# Patient Record
Sex: Male | Born: 1950 | Race: White | Hispanic: No | Marital: Married | Smoking: Current every day smoker
Health system: Southern US, Community
[De-identification: ages and names within clinical notes are randomized; demographics above are authoritative.]

## PROBLEM LIST (undated history)

## (undated) DIAGNOSIS — I10 Essential (primary) hypertension: Secondary | ICD-10-CM

## (undated) DIAGNOSIS — M1990 Unspecified osteoarthritis, unspecified site: Secondary | ICD-10-CM

## (undated) DIAGNOSIS — K219 Gastro-esophageal reflux disease without esophagitis: Secondary | ICD-10-CM

## (undated) HISTORY — PX: JOINT REPLACEMENT: SHX530

---

## 2010-02-07 HISTORY — PX: CHOLECYSTECTOMY: SHX55

## 2010-02-17 ENCOUNTER — Inpatient Hospital Stay (HOSPITAL_COMMUNITY): Admission: EM | Admit: 2010-02-17 | Discharge: 2010-02-18 | Payer: Self-pay | Admitting: Emergency Medicine

## 2010-02-17 ENCOUNTER — Encounter (INDEPENDENT_AMBULATORY_CARE_PROVIDER_SITE_OTHER): Payer: Self-pay | Admitting: General Surgery

## 2010-03-08 ENCOUNTER — Ambulatory Visit: Payer: Self-pay | Admitting: Diagnostic Radiology

## 2010-03-08 ENCOUNTER — Emergency Department (HOSPITAL_BASED_OUTPATIENT_CLINIC_OR_DEPARTMENT_OTHER): Admission: EM | Admit: 2010-03-08 | Discharge: 2010-03-08 | Payer: Self-pay | Admitting: Emergency Medicine

## 2010-08-26 ENCOUNTER — Encounter: Admission: RE | Admit: 2010-08-26 | Discharge: 2010-08-26 | Payer: Self-pay | Admitting: Family Medicine

## 2010-12-27 LAB — DIFFERENTIAL
Basophils Absolute: 0.1 10*3/uL (ref 0.0–0.1)
Basophils Relative: 1 % (ref 0–1)
Eosinophils Absolute: 0.3 10*3/uL (ref 0.0–0.7)
Neutro Abs: 5 10*3/uL (ref 1.7–7.7)
Neutrophils Relative %: 65 % (ref 43–77)

## 2010-12-27 LAB — COMPREHENSIVE METABOLIC PANEL
ALT: 27 U/L (ref 0–53)
Alkaline Phosphatase: 68 U/L (ref 39–117)
BUN: 22 mg/dL (ref 6–23)
CO2: 23 mEq/L (ref 19–32)
Chloride: 101 mEq/L (ref 96–112)
GFR calc non Af Amer: >60 mL/min (ref >60)
Glucose, Bld: 97 mg/dL (ref 70–99)
Potassium: 4.5 mEq/L (ref 3.5–5.1)
Sodium: 137 mEq/L (ref 135–145)
Total Bilirubin: 0.7 mg/dL (ref 0.3–1.2)

## 2010-12-27 LAB — CBC
HCT: 41.4 % (ref 39.0–52.0)
Hemoglobin: 14.2 g/dL (ref 13.0–17.0)
RBC: 4.24 MIL/uL (ref 4.22–5.81)
RDW: 13 % (ref 11.5–15.5)
WBC: 7.9 10*3/uL (ref 4.0–10.5)

## 2010-12-27 LAB — URINALYSIS, ROUTINE W REFLEX MICROSCOPIC
Glucose, UA: NEGATIVE mg/dL
Ketones, ur: NEGATIVE mg/dL
Nitrite: NEGATIVE
Specific Gravity, Urine: 1.009 (ref 1.005–1.030)
pH: 7 (ref 5.0–8.0)

## 2010-12-27 LAB — D-DIMER, QUANTITATIVE: D-Dimer, Quant: 3.08 ug/mL-FEU — ABNORMAL HIGH (ref 0.00–0.48)

## 2010-12-27 LAB — POCT CARDIAC MARKERS: Troponin i, poc: <0.05 ng/mL (ref 0.00–0.09)

## 2010-12-28 LAB — COMPREHENSIVE METABOLIC PANEL
Albumin: 3.9 g/dL (ref 3.5–5.2)
Alkaline Phosphatase: 58 U/L (ref 39–117)
BUN: 20 mg/dL (ref 6–23)
Chloride: 103 mEq/L (ref 96–112)
Creatinine, Ser: 1.1 mg/dL (ref 0.4–1.5)
Glucose, Bld: 149 mg/dL — ABNORMAL HIGH (ref 70–99)
Potassium: 3.5 mEq/L (ref 3.5–5.1)
Total Bilirubin: 0.9 mg/dL (ref 0.3–1.2)
Total Protein: 6.9 g/dL (ref 6.0–8.3)

## 2010-12-28 LAB — CBC
HCT: 49.8 % (ref 39.0–52.0)
Hemoglobin: 17.3 g/dL — ABNORMAL HIGH (ref 13.0–17.0)
MCV: 101.2 fL — ABNORMAL HIGH (ref 78.0–100.0)
Platelets: 152 10*3/uL (ref 150–400)
RDW: 14.3 % (ref 11.5–15.5)

## 2010-12-28 LAB — URINALYSIS, ROUTINE W REFLEX MICROSCOPIC
Nitrite: NEGATIVE
Protein, ur: NEGATIVE mg/dL
Specific Gravity, Urine: 1.015 (ref 1.005–1.030)
Urobilinogen, UA: 0.2 mg/dL (ref 0.0–1.0)

## 2010-12-28 LAB — DIFFERENTIAL
Basophils Absolute: 0 10*3/uL (ref 0.0–0.1)
Basophils Relative: 0 % (ref 0–1)
Lymphocytes Relative: 16 % (ref 12–46)
Neutro Abs: 7.3 10*3/uL (ref 1.7–7.7)
Neutrophils Relative %: 71 % (ref 43–77)

## 2010-12-28 LAB — PROTIME-INR: Prothrombin Time: 15 seconds (ref 11.6–15.2)

## 2010-12-28 LAB — LIPASE, BLOOD: Lipase: 50 U/L (ref 11–59)

## 2010-12-28 LAB — URINE MICROSCOPIC-ADD ON

## 2011-02-01 ENCOUNTER — Other Ambulatory Visit: Payer: Self-pay | Admitting: Nurse Practitioner

## 2011-02-01 DIAGNOSIS — R911 Solitary pulmonary nodule: Secondary | ICD-10-CM

## 2011-03-02 ENCOUNTER — Other Ambulatory Visit: Payer: Self-pay | Admitting: Nurse Practitioner

## 2011-03-02 DIAGNOSIS — R911 Solitary pulmonary nodule: Secondary | ICD-10-CM

## 2011-03-10 ENCOUNTER — Ambulatory Visit
Admission: RE | Admit: 2011-03-10 | Discharge: 2011-03-10 | Disposition: A | Discharge status: Home / Self Care | Payer: 59 | Source: Ambulatory Visit | Attending: Nurse Practitioner | Admitting: Nurse Practitioner

## 2011-03-10 DIAGNOSIS — R911 Solitary pulmonary nodule: Secondary | ICD-10-CM

## 2011-03-10 MED ORDER — IOHEXOL 300 MG/ML  SOLN
75.0000 mL | Freq: Once | INTRAMUSCULAR | Status: AC | PRN
  Administered 2011-03-10: 75 mL via INTRAVENOUS

## 2011-05-10 IMAGING — CT CT ANGIO CHEST
2 of 6 series · 18 of 36 positions shown · IV contrast (APPLIED)
Comparison: None.

CLINICAL DATA: Recent gallbladder surgery.  Fatigue.  Nausea
vomiting and diarrhea.  Esophageal reflux.  Hypertension.  Pain.

CT ANGIOGRAPHY CHEST WITH CONTRAST
TECHNIQUE: Multidetector CT imaging of the chest was performed
using the standard protocol during bolus administration of
intravenous contrast.  Multiplanar CT image reconstructions
including MIPs were obtained to evaluate the vascular anatomy.
Contrast:  80 cc Omnipaque 350

[Series 5: pe 1.0 b25f · axial · 0.70mm/px · z∈[+28,+254]mm · 17 of 252 slices shown]
[im 13/252  lung]
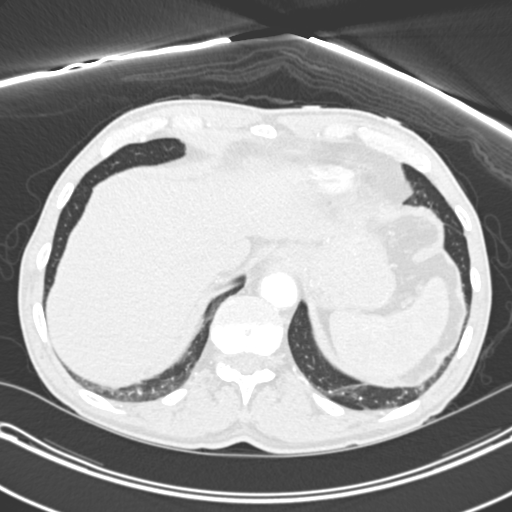
[im 26/252  mediastinal]
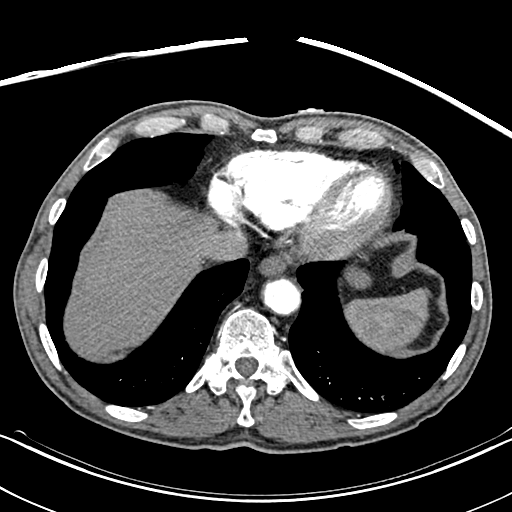
[im 38/252  lung]
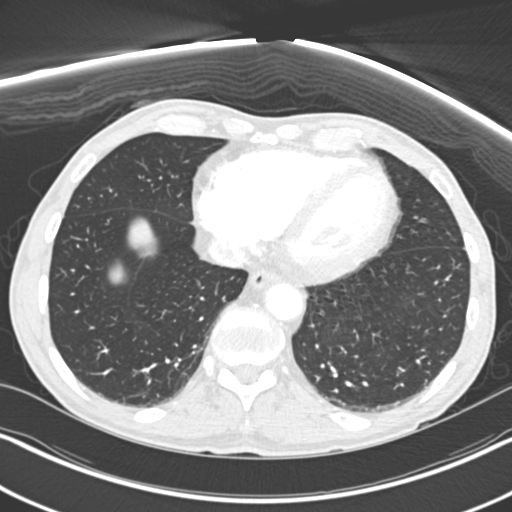
[im 51/252  mediastinal]
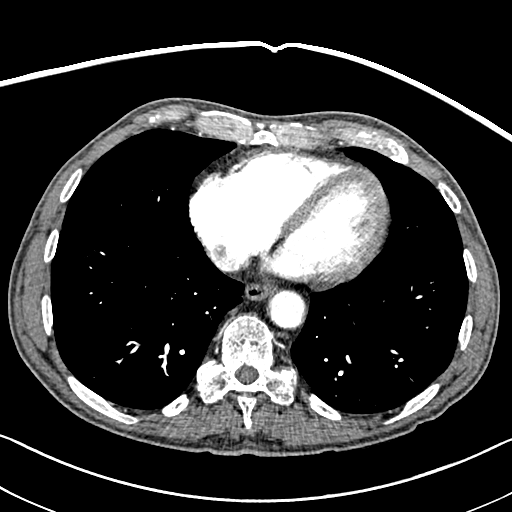
[im 76/252  lung]
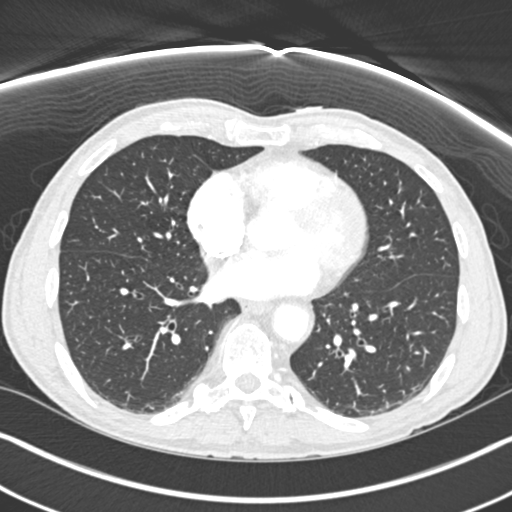
[im 88/252  mediastinal]
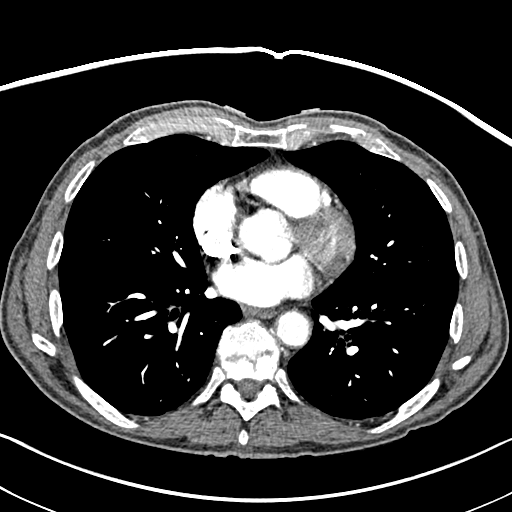
[im 101/252  lung]
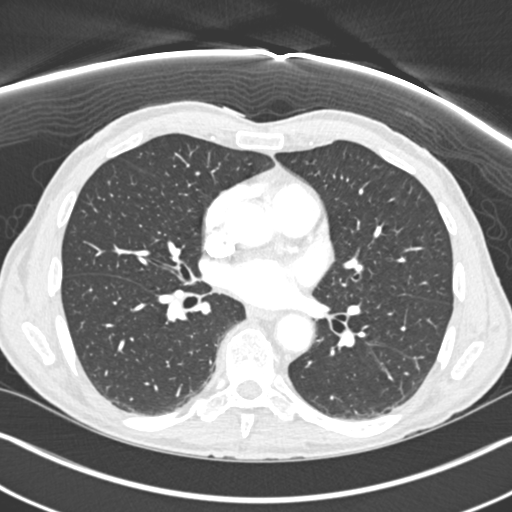
[im 113/252  mediastinal]
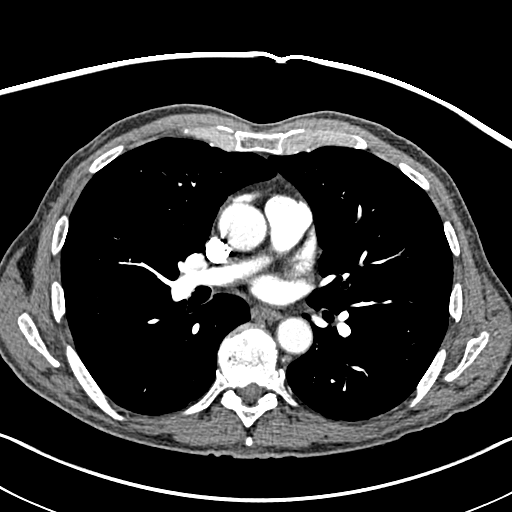
[im 126/252  lung]
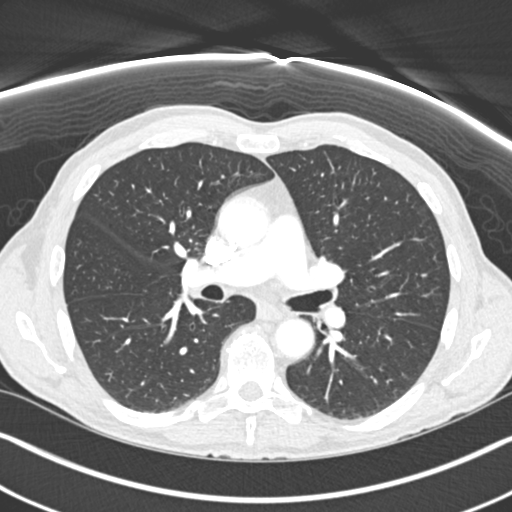
[im 139/252  mediastinal]
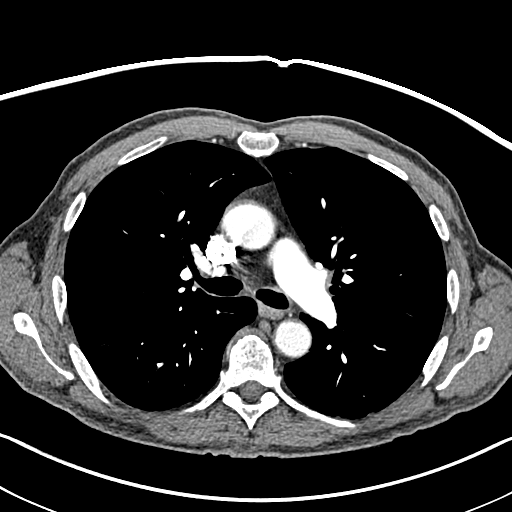
[im 151/252  lung]
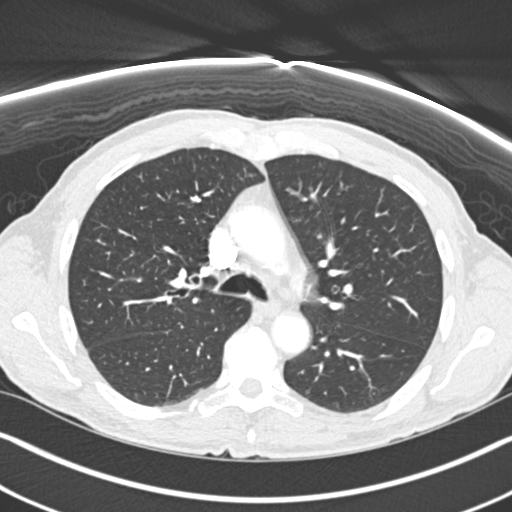
[im 164/252  mediastinal]
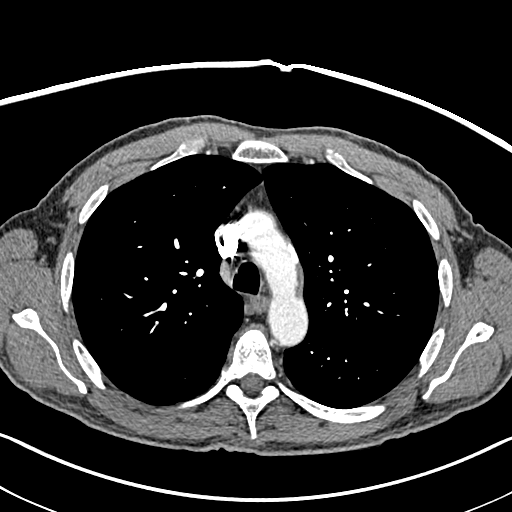
[im 176/252  lung]
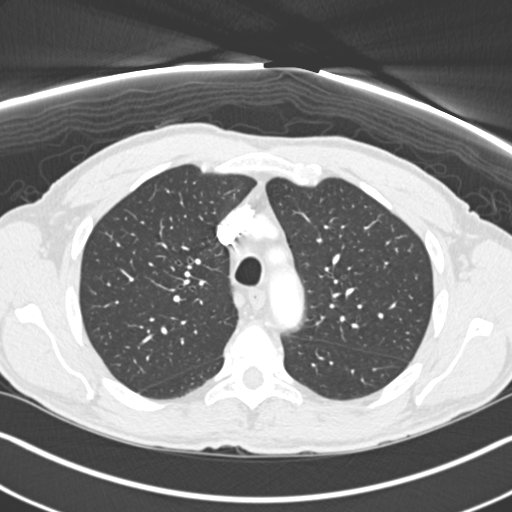
[im 201/252  mediastinal]
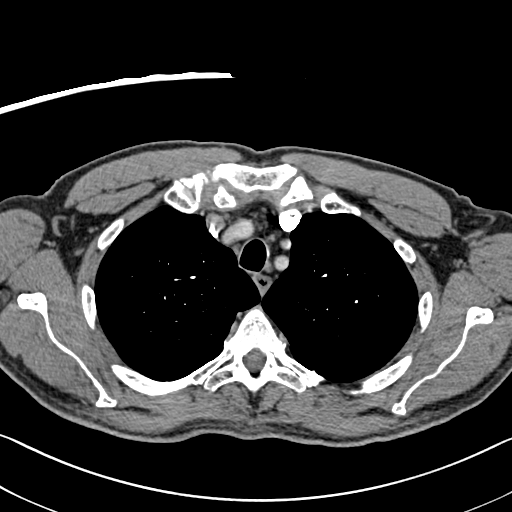
[im 214/252  lung]
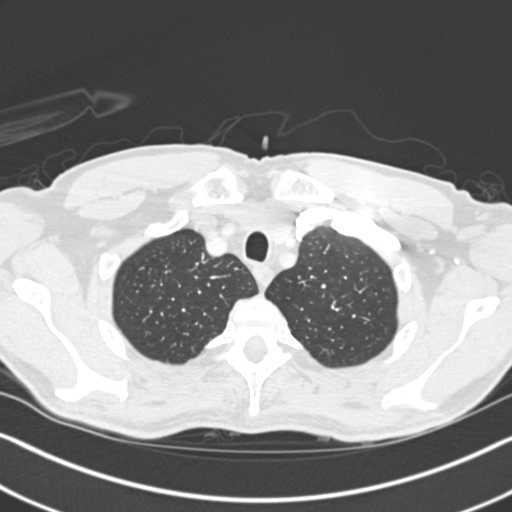
[im 226/252  mediastinal]
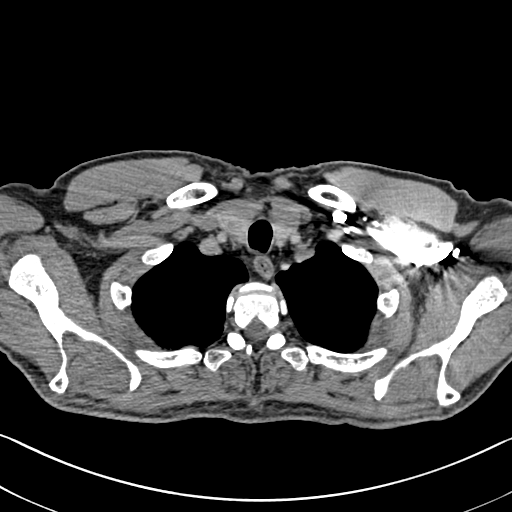
[im 239/252  lung]
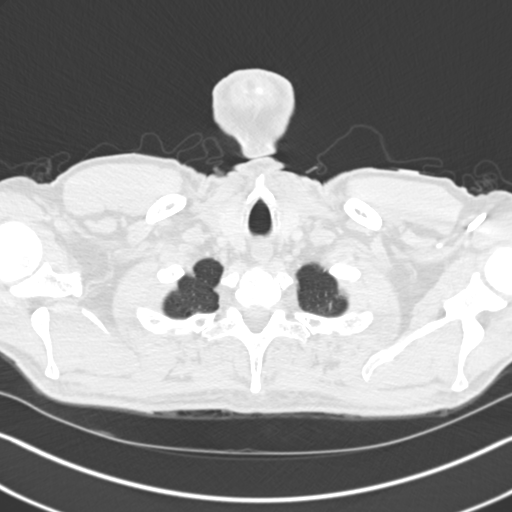

[Series 8: pe 2.0 coronal · coronal · 0.50mm/px · 1 of 128 slices shown]
[im 64/128  mediastinal]
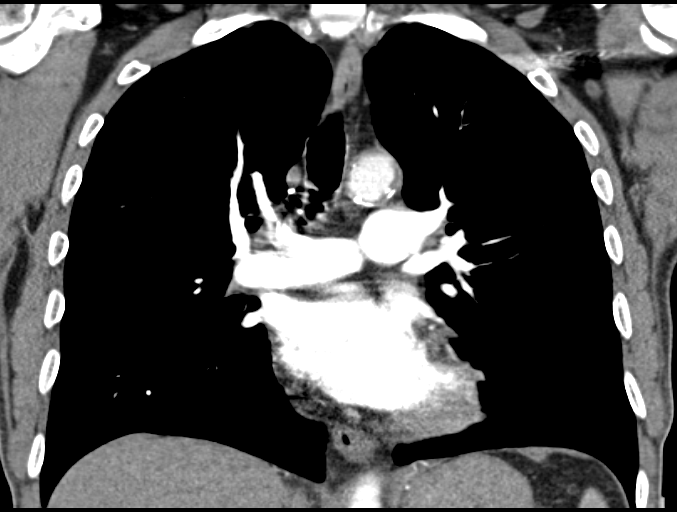

[18 of 36 positions shown; findings below may reference images not displayed]

FINDINGS: There is no filling defect within the opacified
pulmonary arteries to suggest the presence of an acute pulmonary
embolus.  No thoracic aortic aneurysm.  There is no dissection flap
within the thoracic aorta.

No axillary, mediastinal, or hilar lymphadenopathy.  The heart size
is normal.  No pericardial or pleural effusion.

Lung windows reveal a small adherent focus along the distal
trachea, probably related to mucous or secretions.  There is some
trace compressive atelectasis in the dependent lower lobes.  No
focal airspace consolidation.  4 mm right upper lobe pulmonary
nodule is visualized on image 20.  Left lung is clear.

Imaged bony structures of the thorax are intact.

Review of the MIP images confirms the above findings.
IMPRESSION: No CT evidence for acute pulmonary embolus.

4 mm right upper lobe pulmonary nodule.  If the patient is at high
risk for bronchogenic carcinoma, follow-up chest CT at 1 year is
recommended.  If the patient is at low risk, no follow-up is
needed.  This recommendation follows the consensus statement:
Guidelines for Management of Small Pulmonary Nodules Detected on CT
Scans:  A Statement from the [HOSPITAL] as published in
[URL]

## 2011-11-11 HISTORY — PX: TRANSURETHRAL RESECTION OF PROSTATE: SHX73

## 2013-10-24 ENCOUNTER — Encounter (HOSPITAL_BASED_OUTPATIENT_CLINIC_OR_DEPARTMENT_OTHER): Payer: Self-pay | Admitting: *Deleted

## 2013-10-24 NOTE — Progress Notes (Signed)
To [facility] on arrival-plans to bring current Ekg.Instructed Npo after Mn-also refrain from smoking.will take atenolol,omeprazole with small amt water that am.

## 2013-10-25 NOTE — H&P (Signed)
  Reviewed procedure and history.  No changes, plan on surgery, left foot

## 2013-10-25 NOTE — H&P (Signed)
Visit Note - Pre-Op/POV   Provider: Sherlynn StallsJason  Maricruz Lucero, DPM Encounter Date: Oct 17, 2013  Patient: Gary Wang, Gary Wang    ([id number]) Sex: Male       DOB: Sep 25, 1951      Age: 63 year        Race: White Address: [address]  [place]  [place]  [id number] Primary Dr.: Kennyth ArnoldSTACY HILL [place]: [employer] [place]  Referred By:  Kennyth ArnoldSTACY HILL  Complaint: History of present illness: Patient presents for a pre op consult regarding the hallux limitus problem he has. He wants to go ahead and schedule this. We talked about it at some length last visit. He has chronic pain in the first MTP joint and he would like to have an arthrodesis. He does have joint space destruction and hypertrophic bone with limited motion at the joint. It bothers him every day. He has had a history of multiple surgeries including bilateral knee surgeries, shoulder surgery and elbow surgery.  Current Medication: 1. Keflex 500 Mg Capsule  SIG: 1 POBID 2. Acyclovir 200 Mg Capsule (Other MD)  3. Aspirin 81 Mg Tablet Chew (Other MD)  4. Atenolol 50 Mg Tablet (Other MD)  5. Diphenoxylate-atropine 2.5-0.025 Mg/5 Ml Liquid (Other MD)  6. Drisol (Other MD)  7. Hydrochlorothiazide 25 Mg Tab (Other MD)  8. Klonopin 0.5 Mg Tablet (Other MD)  9. Lotensin 5 Mg Tablet (Other MD)  10. Norco 5-325 Tablet Mg (Other MD)  11. Omeprazole Dr 10 Mg Capsule (Other MD)  12. Tramadol Hcl 50 Mg Tablet (Other MD)  13. Tylenol 325 Mg Caplet (Other MD)  14. Zostavax Vial 19,400 Unit/0.65 Ml (Other MD)   ROS: Integument: Negative for rashes, skin cancer, slow healing wounds, cellulitis. Musculoskeletal:  (+) for low back pain. Constitutional: No fever, chills, or nausea.  Medical History: Stomach Ulcer.  High Blood Pressure.  Vascular grafts: None.  Joint implant(s): Positive for joint implant.  Heart valve(s): None.  Chemotherapy: No.  Serious Injuries: None.   Surgeries: No recent surgeries.  Family History/Social  History: Heart Attack: Father. High Blood Pressure: Father.  Smoking History: Alcohol Use:  Patient drinks moderately.  INSULIN: Patient denies taking insulin. COUMADIN/PLAVIX USE: Patient denies taking coumadin/plavix.  Allergy: NKDA - No Known Drug Allergies, Latex  Examination: Patient is alert and oriented times three. Patient is in no distress and looks stated age with good attention to grooming. Vascular status intact with palpable pulses and no significant lower leg edema. Light touch intact with normal reflexes. Adequate muscle strength and range of motion in the ankles and toes. Skin is warm and well perfused.  HALLUX LIMITUS: Dorsal bunion with pain present on the LEFT foot. Elevated first ray noted. Hallux abductus interphalangeus present as well.  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 This summary is a preliminary report and not a chart note. Information may not be finalized. For complete chart noted from the medical record please call to request.  Established office visit to discuss etiology treatment and prognosis   Diagnosis: hallux limitus/ridigus  EVALUATION & MANAGEMENT: Surgical Consultation: Discussed surgical versus conservative treatment options. Risks versus benefits were discussed along with the nature of the procedure, post-operative course. Discussed possible complications, not limited to, such as: slow-healing, infection, need for further surgery, chronic pain (RSDS), blood clots, bleeding  problems, weakness, chronic swelling of the foot/digits, and arthritis. Rare but serious complications can even result in loss of digit, loss of limb, or loss of life. No guarantees were given. Alternatives to surgery were discussed today.  PLANNED PROCEDURE(S). Left: first metatarsal phalangeal  joint arthrodesis using anatomical plate fixation.   We will begin planning for scheduling the surgery based on patient's preference.  Return Visit: He is to return one week after surgery.    PROCEDURES/IMAGING/SUPPLIES:  Venipuncture: Labs drawn for Surgical profile.  Supplies:    PNEUMATIC CAM WALKER:  Prescribed PNEUMATIC CAM WALKER for the LEFT LOWER EXTREMITY. Indications: (reduce pressure at the surgical area by immobilizing flexion and extension at the ankle as well as reducing plantar pressure forces via the rocker bottom outsole) . Patient will make a decision today based on cost and/or precertification. I believe these are necessary based on patient's condition and should help in the long term management of symptoms.  DME Prescriptions: Rx: Pneumatic Cam Walker Disp: Left  Sig: Use as directed while weightbearing. Can remove for range of motion and bathing. This has a rocker bottom to offload the foot during ambulation. Can be removed when nonweightbearing to perform range of motion exercises and keep muscles toned. This should reduce the risk of blood clots compared to a fiberglass cast. Patient instructed on the use of the device.   This visit note has been electronically signed off by Sherlynn Stalls, DPM.

## 2013-10-28 ENCOUNTER — Encounter (HOSPITAL_BASED_OUTPATIENT_CLINIC_OR_DEPARTMENT_OTHER): Payer: Self-pay

## 2013-10-28 ENCOUNTER — Encounter (HOSPITAL_BASED_OUTPATIENT_CLINIC_OR_DEPARTMENT_OTHER): Payer: BC Managed Care – PPO | Admitting: Anesthesiology

## 2013-10-28 ENCOUNTER — Encounter (HOSPITAL_BASED_OUTPATIENT_CLINIC_OR_DEPARTMENT_OTHER)
Admission: RE | Disposition: A | Discharge status: Home / Self Care | Payer: Self-pay | Source: Ambulatory Visit | Attending: Podiatry

## 2013-10-28 ENCOUNTER — Ambulatory Visit (HOSPITAL_BASED_OUTPATIENT_CLINIC_OR_DEPARTMENT_OTHER)
Admission: RE | Admit: 2013-10-28 | Discharge: 2013-10-28 | Disposition: A | Discharge status: Home / Self Care | Payer: BC Managed Care – PPO | Source: Ambulatory Visit | Attending: Podiatry | Admitting: Podiatry

## 2013-10-28 ENCOUNTER — Ambulatory Visit (HOSPITAL_BASED_OUTPATIENT_CLINIC_OR_DEPARTMENT_OTHER): Payer: BC Managed Care – PPO | Admitting: Anesthesiology

## 2013-10-28 DIAGNOSIS — I10 Essential (primary) hypertension: Secondary | ICD-10-CM | POA: Insufficient documentation

## 2013-10-28 DIAGNOSIS — M65979 Unspecified synovitis and tenosynovitis, unspecified ankle and foot: Secondary | ICD-10-CM | POA: Insufficient documentation

## 2013-10-28 DIAGNOSIS — F17200 Nicotine dependence, unspecified, uncomplicated: Secondary | ICD-10-CM | POA: Insufficient documentation

## 2013-10-28 DIAGNOSIS — M19079 Primary osteoarthritis, unspecified ankle and foot: Secondary | ICD-10-CM | POA: Insufficient documentation

## 2013-10-28 DIAGNOSIS — M2020 Hallux rigidus, unspecified foot: Secondary | ICD-10-CM | POA: Insufficient documentation

## 2013-10-28 DIAGNOSIS — Z79899 Other long term (current) drug therapy: Secondary | ICD-10-CM | POA: Insufficient documentation

## 2013-10-28 DIAGNOSIS — M2022 Hallux rigidus, left foot: Secondary | ICD-10-CM

## 2013-10-28 DIAGNOSIS — K219 Gastro-esophageal reflux disease without esophagitis: Secondary | ICD-10-CM | POA: Insufficient documentation

## 2013-10-28 DIAGNOSIS — M659 Synovitis and tenosynovitis, unspecified: Secondary | ICD-10-CM | POA: Insufficient documentation

## 2013-10-28 HISTORY — DX: Essential (primary) hypertension: I10

## 2013-10-28 HISTORY — PX: ANKLE FUSION: SHX881

## 2013-10-28 HISTORY — DX: Gastro-esophageal reflux disease without esophagitis: K21.9

## 2013-10-28 HISTORY — DX: Unspecified osteoarthritis, unspecified site: M19.90

## 2013-10-28 LAB — POCT I-STAT 4, (NA,K, GLUC, HGB,HCT)
Glucose, Bld: 101 mg/dL — ABNORMAL HIGH (ref 70–99)
HEMATOCRIT: 54 % — AB (ref 39.0–52.0)
HEMOGLOBIN: 18.4 g/dL — AB (ref 13.0–17.0)
Potassium: 3.7 mEq/L (ref 3.7–5.3)
Sodium: 137 mEq/L (ref 137–147)

## 2013-10-28 SURGERY — ARTHRODESIS ANKLE
Anesthesia: Monitor Anesthesia Care | Site: Foot | Laterality: Left

## 2013-10-28 MED ORDER — FENTANYL CITRATE 0.05 MG/ML IJ SOLN
INTRAMUSCULAR | Status: DC | PRN
  Administered 2013-10-28: 50 ug via INTRAVENOUS

## 2013-10-28 MED ORDER — OXYCODONE HCL 5 MG/5ML PO SOLN
5.0000 mg | Freq: Once | ORAL | Status: DC | PRN
  Filled 2013-10-28: qty 5

## 2013-10-28 MED ORDER — CEFAZOLIN SODIUM-DEXTROSE 2-3 GM-% IV SOLR
2.0000 g | INTRAVENOUS | Status: AC
  Administered 2013-10-28: 2 g via INTRAVENOUS
  Filled 2013-10-28: qty 50

## 2013-10-28 MED ORDER — MIDAZOLAM HCL 5 MG/5ML IJ SOLN
INTRAMUSCULAR | Status: DC | PRN
  Administered 2013-10-28: 2 mg via INTRAVENOUS

## 2013-10-28 MED ORDER — MIDAZOLAM HCL 2 MG/2ML IJ SOLN
INTRAMUSCULAR | Status: AC
  Filled 2013-10-28: qty 4

## 2013-10-28 MED ORDER — KETOROLAC TROMETHAMINE 30 MG/ML IJ SOLN
30.0000 mg | Freq: Once | INTRAMUSCULAR | Status: DC
  Filled 2013-10-28: qty 1

## 2013-10-28 MED ORDER — OXYCODONE HCL 5 MG PO TABS
5.0000 mg | ORAL_TABLET | Freq: Once | ORAL | Status: DC | PRN
  Filled 2013-10-28: qty 1

## 2013-10-28 MED ORDER — HYDROMORPHONE HCL PF 1 MG/ML IJ SOLN
0.2500 mg | INTRAMUSCULAR | Status: DC | PRN
  Filled 2013-10-28: qty 1

## 2013-10-28 MED ORDER — LACTATED RINGERS IV SOLN: INTRAVENOUS | Status: DC | PRN

## 2013-10-28 MED ORDER — MEPERIDINE HCL 25 MG/ML IJ SOLN
6.2500 mg | INTRAMUSCULAR | Status: DC | PRN
  Filled 2013-10-28: qty 1

## 2013-10-28 MED ORDER — PROMETHAZINE HCL 25 MG/ML IJ SOLN
6.2500 mg | INTRAMUSCULAR | Status: DC | PRN
  Filled 2013-10-28: qty 1

## 2013-10-28 MED ORDER — KETOROLAC TROMETHAMINE 30 MG/ML IJ SOLN
INTRAMUSCULAR | Status: DC | PRN
  Administered 2013-10-28: 30 mg via INTRAVENOUS

## 2013-10-28 MED ORDER — BUPIVACAINE-EPINEPHRINE PF 0.5-1:200000 % IJ SOLN
INTRAMUSCULAR | Status: DC | PRN
  Administered 2013-10-28: 09:00:00 via INTRAMUSCULAR

## 2013-10-28 MED ORDER — ONDANSETRON HCL 4 MG/2ML IJ SOLN
INTRAMUSCULAR | Status: DC | PRN
  Administered 2013-10-28: 4 mg via INTRAVENOUS

## 2013-10-28 MED ORDER — SODIUM CHLORIDE 0.9 % IR SOLN
Status: DC | PRN
  Administered 2013-10-28: 09:00:00

## 2013-10-28 MED ORDER — DEXAMETHASONE SODIUM PHOSPHATE 4 MG/ML IJ SOLN
INTRAMUSCULAR | Status: DC | PRN
  Administered 2013-10-28: 4 mg via INTRAVENOUS

## 2013-10-28 MED ORDER — PROPOFOL 10 MG/ML IV EMUL
INTRAVENOUS | Status: DC | PRN
  Administered 2013-10-28: 100 ug/kg/min via INTRAVENOUS

## 2013-10-28 MED ORDER — BUPIVACAINE LIPOSOME 1.3 % IJ SUSP
INTRAMUSCULAR | Status: DC | PRN
  Administered 2013-10-28: 9.5 mL

## 2013-10-28 MED ORDER — FENTANYL CITRATE 0.05 MG/ML IJ SOLN
INTRAMUSCULAR | Status: AC
  Filled 2013-10-28: qty 6

## 2013-10-28 MED ORDER — LACTATED RINGERS IV SOLN
INTRAVENOUS | Status: DC
  Administered 2013-10-28 (×2): via INTRAVENOUS
  Filled 2013-10-28: qty 1000

## 2013-10-28 SURGICAL SUPPLY — 56 items
BIT DRILL 2.0MM (BIT) ×1 IMPLANT
BLADE AVERAGE 25MMX9MM (BLADE) ×1
BLADE AVERAGE 25X9 (BLADE) ×2 IMPLANT
BLADE SURG 15 STRL LF DISP TIS (BLADE) ×3 IMPLANT
BLADE SURG 15 STRL SS (BLADE) ×6
BNDG COHESIVE 3X5 TAN STRL LF (GAUZE/BANDAGES/DRESSINGS) ×3 IMPLANT
BNDG CONFORM 3 STRL LF (GAUZE/BANDAGES/DRESSINGS) ×3 IMPLANT
BNDG ESMARK 4X9 LF (GAUZE/BANDAGES/DRESSINGS) ×3 IMPLANT
CLOTH BEACON ORANGE TIMEOUT ST (SAFETY) ×3 IMPLANT
COVER TABLE BACK 60X90 (DRAPES) ×3 IMPLANT
CUFF TOURN SGL QUICK 18 (TOURNIQUET CUFF) ×3 IMPLANT
DRAPE EXTREMITY T 121X128X90 (DRAPE) ×3 IMPLANT
DRAPE LG THREE QUARTER DISP (DRAPES) ×3 IMPLANT
DRAPE OEC MINIVIEW 54X84 (DRAPES) ×3 IMPLANT
DRILL BIT 2.0MM (BIT) ×2
ELECT REM PT RETURN 9FT ADLT (ELECTROSURGICAL) ×3
ELECTRODE REM PT RTRN 9FT ADLT (ELECTROSURGICAL) ×1 IMPLANT
GAUZE XEROFORM 1X8 LF (GAUZE/BANDAGES/DRESSINGS) ×3 IMPLANT
GLOVE BIOGEL M 6.5 STRL (GLOVE) ×6 IMPLANT
GLOVE BIOGEL M STER SZ 6 (GLOVE) ×3 IMPLANT
GLOVE BIOGEL PI IND STRL 6.5 (GLOVE) ×1 IMPLANT
GLOVE BIOGEL PI IND STRL 7.5 (GLOVE) ×1 IMPLANT
GLOVE BIOGEL PI INDICATOR 6.5 (GLOVE) ×2
GLOVE BIOGEL PI INDICATOR 7.5 (GLOVE) ×2
GLOVE SURG SS PI 8.0 STRL IVOR (GLOVE) ×3 IMPLANT
GOWN PREVENTION PLUS LG XLONG (DISPOSABLE) IMPLANT
GOWN STRL REUS W/TWL LRG LVL3 (GOWN DISPOSABLE) ×6 IMPLANT
GOWN STRL REUS W/TWL XL LVL3 (GOWN DISPOSABLE) ×3 IMPLANT
K-WIRE 1.2X70 (WIRE) ×3 IMPLANT
K-WIRE 1.6X150 (WIRE) ×2
K-WIRE FX150X1.6XSMTH TROC (WIRE) ×1
KWIRE FX150X1.6XSMTH TROC (WIRE) ×1 IMPLANT
NEEDLE HYPO 25X1 1.5 SAFETY (NEEDLE) IMPLANT
NS IRRIG 500ML POUR BTL (IV SOLUTION) ×3 IMPLANT
PACK BASIN DAY SURGERY FS (CUSTOM PROCEDURE TRAY) ×3 IMPLANT
PADDING CAST ABS 4INX4YD NS (CAST SUPPLIES) ×2
PADDING CAST ABS COTTON 4X4 ST (CAST SUPPLIES) ×1 IMPLANT
PENCIL BUTTON HOLSTER BLD 10FT (ELECTRODE) ×3 IMPLANT
PLATE MTP LEFT (Plate) ×3 IMPLANT
SCREW LOCK 3.0X14MM (Screw) ×3 IMPLANT
SCREW NON LOCK 3X22MM (Screw) ×3 IMPLANT
SCREW NONLOCK 3.0X16 (Screw) ×3 IMPLANT
SCREW NONLOCK 3.0X18 (Screw) ×3 IMPLANT
SCREW NONLOCK 3.0X20MM (Screw) ×3 IMPLANT
SPONGE GAUZE 4X4 12PLY (GAUZE/BANDAGES/DRESSINGS) ×3 IMPLANT
STOCKINETTE 4X48 STRL (DRAPES) ×3 IMPLANT
SUCTION FRAZIER TIP 10 FR DISP (SUCTIONS) IMPLANT
SUT ETHILON 4 0 PS 2 18 (SUTURE) ×3 IMPLANT
SUT MNCRL AB 4-0 PS2 18 (SUTURE) ×3 IMPLANT
SYR BULB 3OZ (MISCELLANEOUS) ×3 IMPLANT
SYR CONTROL 10ML LL (SYRINGE) ×3 IMPLANT
TOWEL OR 17X24 6PK STRL BLUE (TOWEL DISPOSABLE) ×6 IMPLANT
TUBE CONNECTING 12'X1/4 (SUCTIONS)
TUBE CONNECTING 12X1/4 (SUCTIONS) IMPLANT
UNDERPAD 30X30 INCONTINENT (UNDERPADS AND DIAPERS) ×3 IMPLANT
WATER STERILE IRR 500ML POUR (IV SOLUTION) ×3 IMPLANT

## 2013-10-28 NOTE — Op Note (Signed)
10/28/2013  9:51 AM  PATIENT:  Gary Wang  63 y.o. male  PRE-OPERATIVE DIAGNOSIS:  LEFT HALLUX RIGIDUS  POST-OPERATIVE DIAGNOSIS:  LEFT HALLUX RIGIDUS  PROCEDURE:  Procedure(s): ARTHRODESIS LEFT GREAT TOE AND 1ST METATARSAL PHALANGEAL JOINT  (Left)  SURGEON:  Surgeon(s) and Role:    * Sherin QuarryJason C. Zeigler, DPM - Primary  PHYSICIAN ASSISTANT:   ASSISTANTS: none   ANESTHESIA:   IV sedation  EBL:  Total I/O In: 200 [I.V.:200] Out: -   BLOOD ADMINISTERED:none  DRAINS: none   LOCAL MEDICATIONS USED:  MARCAINE    and LIDOCAINE and EXAPREL  SPECIMEN:  No Specimen  DISPOSITION OF SPECIMEN:  N/A  COUNTS:  YES  TOURNIQUET:   Total Tourniquet Time Documented: Calf (Left) - 52 minutes Total: Calf (Left) - 52 minutes   DICTATION: .Reubin Milanragon Dictation  PLAN OF CARE: Discharge to home after PACU  PATIENT DISPOSITION:  PACU - hemodynamically stable.   Delay start of Pharmacological VTE agent (>24hrs) due to surgical blood loss or risk of bleeding: not applicable  SURGICAL INDICATIONS:  Patient is here for surgical intervention and surgery is further discussed today based on our in-office consultation.  All questions were answered and consent is signed and in the chart outlining risks versus benefits.  The surgical site is marked today and I reviewed the planned procedure(s) with the patient today in preoperative holding area.  Patient has a hallux limitus and arthritis in the left great toe joint. He wants to have this surgically addressed today. All his questions were answered.  PROCEDURES PERFORMED:  Patient is transported to the operating room and the surgical site is prepped and draped in the usual sterile manner.  He's placed in supine position ankle tourniquet is inflated to 250 mm mercury. Linear incisions made over the first MTP joint sharply and bluntly dissected to subcutaneous tissue and capsule were capsulotomies performed. Medial and lateral edges are undermined exposing  the first metatarsal phalangeal joint into the surgical site. Hypertrophic bone and arthritic bone were removed using a rongeur and sagittal saw. We then used the Stryker anatomic specific first MTP joint fusion plate and screw fixation. We used a lag screw across the joint measuring 22 mm, 3 mm, 2 proximal screws measuring 16 mm and 3 mm in diameter. The distal 16 mm x 3 mm nonlocking screw was placed laterally into the 4 hole plate and then we used a 14 mm x 3 mm locking screw at the distal medial hole. Xi-scan confirmed good alignment of the joint and hardware fixation. We used the Stryker system to ream out the proximal phalanx and the hallux was shifted medially to correct the transverse plane deformity. Standard AO fixation was used to screw the plate into place. We also used rongeurs to remove cartilage and bone from the joint where there was good bone bleeding noted. Wound is flushed with sterile antibiotic solution. Closed with 4-0 Vicryl and 4-0 Monocryl suture. Xeroform gauze, wet to dry dressing applied. Tourniquet is deflated noting good capillary refill time. Patient is placed in a Jones compression cast consisting of cast padding, stockinette Coban.  Patient returns to PACU having tolerated the procedure and anesthesia well.  Patient is given written and oral post op instructions.  Patient will follow up in my office as scheduled for a post op appointment.

## 2013-10-28 NOTE — Transfer of Care (Signed)
Immediate Anesthesia Transfer of Care Note Immediate Anesthesia Transfer of Care Note  Patient: Gary DakinMichael Wang  Procedure(s) Performed: Procedure(s) (LRB): ARTHRODESIS LEFT GREAT TOE AND 1ST METATARSAL PHALANGEAL JOINT  (Left)  Patient Location: PACU  Anesthesia Type: MAC  Level of Consciousness: awake, sedated, patient cooperative and responds to stimulation  Airway & Oxygen Therapy: Patient Spontanous Breathing and Patient connected to face mask oxygen  Post-op Assessment: Report given to PACU RN, Post -op Vital signs reviewed and stable and Patient moving all extremities  Post vital signs: Reviewed and stable  Complications: No apparent anesthesia complications

## 2013-10-28 NOTE — Anesthesia Preprocedure Evaluation (Addendum)
Anesthesia Evaluation  Patient identified by MRN, date of birth, ID band Patient awake    Reviewed: Allergy & Precautions, H&P , NPO status , Patient's Chart, lab work & pertinent test results, reviewed documented beta blocker date and time   Airway Mallampati: II TM Distance: >3 FB Neck ROM: Full    Dental  (+) Dental Advisory Given   Pulmonary Current Smoker,  breath sounds clear to auscultation        Cardiovascular hypertension, Pt. on medications and Pt. on home beta blockers Rhythm:Regular Rate:Normal     Neuro/Psych negative neurological ROS  negative psych ROS   GI/Hepatic Neg liver ROS, GERD-  Medicated,  Endo/Other  negative endocrine ROS  Renal/GU negative Renal ROS  negative genitourinary   Musculoskeletal negative musculoskeletal ROS (+)   Abdominal   Peds negative pediatric ROS (+)  Hematology negative hematology ROS (+)   Anesthesia Other Findings   Reproductive/Obstetrics negative OB ROS                          Anesthesia Physical Anesthesia Plan  ASA: II  Anesthesia Plan: MAC   Post-op Pain Management:    Induction: Intravenous  Airway Management Planned:   Additional Equipment:   Intra-op Plan:   Post-operative Plan:   Informed Consent: I have reviewed the patients History and Physical, chart, labs and discussed the procedure including the risks, benefits and alternatives for the proposed anesthesia with the patient or authorized representative who has indicated his/her understanding and acceptance.   Dental advisory given  Plan Discussed with: CRNA  Anesthesia Plan Comments:         Anesthesia Quick Evaluation

## 2013-10-28 NOTE — Anesthesia Procedure Notes (Signed)
Procedure Name: MAC Date/Time: 10/28/2013 8:30 AM Performed by: Jessica PriestBEESON, Gary Kyler C Pre-anesthesia Checklist: Patient identified, Timeout performed, Emergency Drugs available, Suction available and Patient being monitored Patient Re-evaluated:Patient Re-evaluated prior to inductionOxygen Delivery Method: Simple face mask Preoxygenation: Pre-oxygenation with 100% oxygen Intubation Type: IV induction Placement Confirmation: positive ETCO2 and breath sounds checked- equal and bilateral

## 2013-10-28 NOTE — Discharge Instructions (Addendum)
Podiatry Postoperative Discharge Instructions Dr. Elby Showers  1.  Day of Surgery: Please have your prescription (s) filled immediately upon leaving the surgery center if you have not already done so. Elevate both feet in the car on the way home. Please go directly to bed and keep your feet elevated by putting two pillows under your feet and one pillow under your knees. Keep your feet out from under the blanket.  2.  Discomfort and Swelling: Your foot may be numb for the remainder of the day. Swelling is expected. In some cases the skin of the foot or the leg may take on a bruised, black and blue appearance.  3.  Temperature: Take your temperature on the 2nd, 3rd, and 4th days after your surgery at 5pm. If your temperature is above 101 degrees please give your physician a call.  4.  Bleeding: A slight amount of drainage on the dressing is normal. Resting your foot in an elevated position will limit bleeding. If bleeding continues, wrap a towel around your foot and apply an ice pack.  5.  Dressing: Keep the bandage absolutely dry and do not remove the dressing. If the dressing becomes wet or soiled, call your physician's office immediately.  6.  Stitches: The stitches will remain in place for 2-3 weeks, depending on the nature of your operation. Slight pulling sensations may be felt due to the stitches. This is a normal occurrence.  7.  Ice: Apply a well-sealed ice pack to your foot for 30 minutes of every hour for the first 24 hours after your surgery. This means 30 minutes on and 30 minutes off, each hour while awake. Do not leave the ice pack in place at bedtime or during long naps. If the ice you are using becomes wet on the outside as the ice melts, place a washcloth between the ice pack and the dressing.  8.  Shoes: Wear your postoperative shoe or boot anytime you put weight on your feet, even if it is just to walk to the bathroom and back. It will be at least 2-3 weeks before you can wear your  regular shoes again. Please do not wear anything other than your postoperative shoe until told to do so by your physician.  9.  Diet: Return to your regular diet slowly within 24 hours. If you received sedation, your first meal should be light. Do not eat greasy or spicy foods for the first meal. Drink large quantities of liquids, especially water, citrus, and other fruit juices. Please call if unable to keep fluids down.  10. General Activities: Recovery is a gradual process; however, you should feel better with each passing day. On the first day, only leave the bed to go to the bathroom. Gradually increase your activity after the first day. For the first week or two, resting each day is important; strenuous work, heavy lifting, and excessive social activities should be avoided.   Podiatry Postoperative Discharge Instructions ( Page 2)  11. Post-operative Care: The post-operative care period lasts for approximately six weeks. During this time, periodic visits to your physician's office will be required so your healing process can be monitored closely. It is essential for your future health that you continue to be monitored by your physician until you are discharged and completely healed. Care during this time is the most important part of your recovery process.  12. Recovery from Anesthesia: You may feel drowsy and your reflexes may be slowed for 24 hours. Do not drive, use  machinery, appliances, or ride bicycles or scooters. Do not make important decisions.  13. Complications: Please call your physician following your surgery if you have any of the following complications:      1. Severe pain unrelieved by medication      2. Excessive, heavy, or prolonged bleeding     3. Dizziness or fainting     4. Soiled or wet dressings     5. Temperature over 101.0 degrees   Dr. Suzette BattiestZeigler, [facility] [place] Office: [phone] Answering Service: [phone]   Use Cam walker and  keep dressing in place until seen in our office.   Post Anesthesia Home Care Instructions  Activity: Get plenty of rest for the remainder of the day. A responsible adult should stay with you for 24 hours following the procedure.  For the next 24 hours, DO NOT: -Drive a car -Advertising copywriterperate machinery -Drink alcoholic beverages -Take any medication unless instructed by your physician -Make any legal decisions or sign important papers.  Meals: Start with liquid foods such as gelatin or soup. Progress to regular foods as tolerated. Avoid greasy, spicy, heavy foods. If nausea and/or vomiting occur, drink only clear liquids until the nausea and/or vomiting subsides. Call your physician if vomiting continues.  Special Instructions/Symptoms: Your throat may feel dry or sore from the anesthesia or the breathing tube placed in your throat during surgery. If this causes discomfort, gargle with warm salt water. The discomfort should disappear within 24 hours. becomes wet on the outside as the ice melts, place a washcloth between the ice pack and the dressing. ds down.  10. General Activities: Recovery is a gradual process; however, you should feel better with each passing day. On the first day, only leave the bed to go to the bathroom. Gradually increase your activity after the first day. For the first week or two, resting each day is important; strenuous work, heavy lifting, and excessive social activities should be avoided.   Podiatry Postoperative Discharge Instructions ( Page 2)13. Complications: Please call your physician following your surgery if you have any of the following complications:      1. Severe pain unrelieved by medication      2. Excessive, heavy, or prolonged bleeding     3. Dizziness or fainting     4. Soiled or wet dressings     5. Temperature over 101.0 degrees   Dr. Suzette BattiestZeigler, [facility] [place] Office: [phone] Answering Service:  [phone]   Regional Anesthesia Blocks  1. Numbness or the inability to move the "blocked" extremity may last from 3-48 hours after placement. The length of time depends on the medication injected and your individual response to the medication. If the numbness is not going away after 48 hours, call your surgeon.  2. The extremity that is blocked will need to be protected until the numbness is gone and the  Strength has returned. Because you cannot feel it, you will need to take extra care to avoid injury. Because it may be weak, you may have difficulty moving it or using it. You may not know what position it is in without looking at it while the block is in effect.  3. For blocks in the legs and feet, returning to weight bearing and walking needs to be done carefully. You will need to wait until the numbness is entirely gone and the strength has returned. You should be able to move your leg and foot normally before you try and  bear weight or walk. You will need someone to be with you when you first try to ensure you do not fall and possibly risk injury.  4. Bruising and tenderness at the needle site are common side effects and will resolve in a few days.  5. Persistent numbness or new problems with movement should be communicated to the surgeon or the [facility] [id number] [facility] [phone]).Information for Discharge Teaching: EXPAREL (bupivacaine liposome injectable suspension)   Your surgeon gave you EXPAREL(bupivacaine) in your surgical incision to help control your pain after surgery.   EXPAREL is a local anesthetic that provides pain relief by numbing the tissue around the surgical site.  EXPAREL is designed to release pain medication over time and can control pain for up to 72 hours.  Depending on how you respond to EXPAREL, you may require less pain medication during your recovery.  Possible side effects:  Temporary loss of sensation or  ability to move in the area where bupivacaine was injected.  Nausea, vomiting, constipation  Rarely, numbness and tingling in your mouth or lips, lightheadedness, or anxiety may occur.  Call your doctor right away if you think you may be experiencing any of these sensations, or if you have other questions regarding possible side effects.  Follow all other discharge instructions given to you by your surgeon or nurse. Eat a healthy diet and drink plenty of water or other fluids.  If you return to the hospital for any reason within 96 hours following the administration of EXPAREL, please inform your health care providers.

## 2013-11-01 ENCOUNTER — Encounter (HOSPITAL_BASED_OUTPATIENT_CLINIC_OR_DEPARTMENT_OTHER): Payer: Self-pay | Admitting: Podiatry

## 2013-11-01 NOTE — Anesthesia Postprocedure Evaluation (Signed)
Anesthesia Post Note  Patient: Gary Wang  Procedure(s) Performed: Procedure(s) (LRB): ARTHRODESIS LEFT GREAT TOE AND 1ST METATARSAL PHALANGEAL JOINT  (Left)  Anesthesia type: MAC  Patient location: PACU  Post pain: Pain level controlled  Post assessment: Post-op Vital signs reviewed  Last Vitals: BP 130/80  Pulse 54  Temp(Src) 36.1 C (Oral)  Resp 17  Wt 147 lb (66.679 kg)  SpO2 94%  Post vital signs: Reviewed  Level of consciousness: awake  Complications: No apparent anesthesia complications

## 2024-06-10 DEATH — deceased
